# Patient Record
Sex: Female | Born: 2000 | Race: Black or African American | Hispanic: No | Marital: Single | Smoking: Never smoker
Health system: Southern US, Community
[De-identification: ages and names within clinical notes are randomized; demographics above are authoritative.]

## PROBLEM LIST (undated history)

## (undated) DIAGNOSIS — B9689 Other specified bacterial agents as the cause of diseases classified elsewhere: Secondary | ICD-10-CM

## (undated) DIAGNOSIS — Z789 Other specified health status: Secondary | ICD-10-CM

## (undated) HISTORY — PX: NO PAST SURGERIES: SHX2092

## (undated) HISTORY — DX: Acute vaginitis: B96.89

## (undated) HISTORY — DX: Other specified health status: Z78.9

---
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
## 2022-05-31 ENCOUNTER — Ambulatory Visit: Payer: Medicaid Other

## 2022-06-02 ENCOUNTER — Ambulatory Visit: Payer: Medicaid Other

## 2022-06-02 NOTE — Progress Notes (Deleted)
    NURSE VISIT NOTE  Subjective:    Patient ID: Shawna Sanchez, female    DOB: 2000/03/05, 22 y.o.   MRN: [id number]       HPI  Patient is a 22 y.o. G0P0000 female who presents for {UTI Symptoms:210800002} for {0-10:33138} {TIME; UNITS DAY/WEEK/MONTH:19136}.  Patient denies {UTI Symptoms:210800002}.  Patient {does/does not:33181} have a history of recurrent UTI.  Patient {does/does not:33181} have a history of pyelonephritis.    Objective:    There were no vitals taken for this visit.   Lab Review  No results found for any visits on 06/02/22.  Assessment:   No diagnosis found.   Plan:   {AOB UTI PLAN:28528:p}   Cornelius Moras, CMA

## 2022-06-03 ENCOUNTER — Ambulatory Visit: Payer: Medicaid Other

## 2022-06-11 ENCOUNTER — Ambulatory Visit: Payer: Medicaid Other
